# Patient Record
Sex: Female | Born: 1950 | Smoking: Former smoker
Health system: Southern US, Community
[De-identification: ages and names within clinical notes are randomized; demographics above are authoritative.]

## PROBLEM LIST (undated history)

## (undated) DIAGNOSIS — H409 Unspecified glaucoma: Secondary | ICD-10-CM

## (undated) DIAGNOSIS — I10 Essential (primary) hypertension: Secondary | ICD-10-CM

## (undated) DIAGNOSIS — E785 Hyperlipidemia, unspecified: Secondary | ICD-10-CM

---

## 2011-12-26 ENCOUNTER — Other Ambulatory Visit: Payer: Self-pay | Admitting: *Deleted

## 2016-09-25 HISTORY — PX: CATARACT EXTRACTION: SUR2

## 2020-01-28 ENCOUNTER — Other Ambulatory Visit: Payer: Self-pay | Admitting: Nurse Practitioner

## 2020-01-28 DIAGNOSIS — N6320 Unspecified lump in the left breast, unspecified quadrant: Secondary | ICD-10-CM

## 2020-02-12 ENCOUNTER — Other Ambulatory Visit: Payer: Self-pay

## 2020-02-12 ENCOUNTER — Ambulatory Visit
Admission: RE | Admit: 2020-02-12 | Discharge: 2020-02-12 | Disposition: A | Discharge status: Home / Self Care | Payer: Medicare Other | Source: Ambulatory Visit | Attending: Nurse Practitioner | Admitting: Nurse Practitioner

## 2020-02-12 DIAGNOSIS — N6320 Unspecified lump in the left breast, unspecified quadrant: Secondary | ICD-10-CM

## 2020-02-26 ENCOUNTER — Ambulatory Visit: Payer: Self-pay | Admitting: General Surgery

## 2020-02-26 DIAGNOSIS — N6022 Fibroadenosis of left breast: Secondary | ICD-10-CM

## 2020-03-10 ENCOUNTER — Other Ambulatory Visit: Payer: Self-pay | Admitting: General Surgery

## 2020-03-10 DIAGNOSIS — N6022 Fibroadenosis of left breast: Secondary | ICD-10-CM

## 2020-04-07 ENCOUNTER — Encounter (HOSPITAL_BASED_OUTPATIENT_CLINIC_OR_DEPARTMENT_OTHER): Payer: Self-pay | Admitting: General Surgery

## 2020-04-07 ENCOUNTER — Other Ambulatory Visit: Payer: Self-pay

## 2020-04-10 ENCOUNTER — Inpatient Hospital Stay (HOSPITAL_COMMUNITY): Admission: RE | Admit: 2020-04-10 | Payer: 59 | Source: Ambulatory Visit

## 2020-04-12 ENCOUNTER — Other Ambulatory Visit (HOSPITAL_COMMUNITY)
Admission: RE | Admit: 2020-04-12 | Discharge: 2020-04-12 | Disposition: A | Discharge status: Home / Self Care | Payer: 59 | Source: Ambulatory Visit | Attending: General Surgery | Admitting: General Surgery

## 2020-04-12 ENCOUNTER — Encounter (HOSPITAL_BASED_OUTPATIENT_CLINIC_OR_DEPARTMENT_OTHER)
Admission: RE | Admit: 2020-04-12 | Discharge: 2020-04-12 | Disposition: A | Discharge status: Home / Self Care | Payer: 59 | Source: Ambulatory Visit | Attending: General Surgery | Admitting: General Surgery

## 2020-04-12 DIAGNOSIS — Z01810 Encounter for preprocedural cardiovascular examination: Secondary | ICD-10-CM | POA: Diagnosis present

## 2020-04-12 DIAGNOSIS — Z20822 Contact with and (suspected) exposure to covid-19: Secondary | ICD-10-CM | POA: Diagnosis not present

## 2020-04-12 LAB — SARS CORONAVIRUS 2 (TAT 6-24 HRS): SARS Coronavirus 2: NEGATIVE

## 2020-04-12 NOTE — Progress Notes (Signed)

## 2020-04-13 ENCOUNTER — Other Ambulatory Visit: Payer: Self-pay

## 2020-04-13 ENCOUNTER — Ambulatory Visit
Admission: RE | Admit: 2020-04-13 | Discharge: 2020-04-13 | Disposition: A | Discharge status: Home / Self Care | Payer: Medicare Other | Source: Ambulatory Visit | Attending: General Surgery | Admitting: General Surgery

## 2020-04-13 DIAGNOSIS — N6022 Fibroadenosis of left breast: Secondary | ICD-10-CM

## 2020-04-14 ENCOUNTER — Encounter (HOSPITAL_BASED_OUTPATIENT_CLINIC_OR_DEPARTMENT_OTHER): Payer: Self-pay | Admitting: General Surgery

## 2020-04-14 ENCOUNTER — Ambulatory Visit
Admission: RE | Admit: 2020-04-14 | Discharge: 2020-04-14 | Disposition: A | Discharge status: Home / Self Care | Payer: Medicare Other | Source: Ambulatory Visit | Attending: General Surgery | Admitting: General Surgery

## 2020-04-14 ENCOUNTER — Encounter (HOSPITAL_BASED_OUTPATIENT_CLINIC_OR_DEPARTMENT_OTHER)
Admission: RE | Disposition: A | Discharge status: Home / Self Care | Payer: Self-pay | Source: Home / Self Care | Attending: General Surgery

## 2020-04-14 ENCOUNTER — Other Ambulatory Visit: Payer: Self-pay

## 2020-04-14 ENCOUNTER — Ambulatory Visit (HOSPITAL_BASED_OUTPATIENT_CLINIC_OR_DEPARTMENT_OTHER)
Admission: RE | Admit: 2020-04-14 | Discharge: 2020-04-14 | Disposition: A | Discharge status: Home / Self Care | Payer: 59 | Attending: General Surgery | Admitting: General Surgery

## 2020-04-14 ENCOUNTER — Ambulatory Visit (HOSPITAL_BASED_OUTPATIENT_CLINIC_OR_DEPARTMENT_OTHER): Payer: 59 | Admitting: Anesthesiology

## 2020-04-14 DIAGNOSIS — Z79899 Other long term (current) drug therapy: Secondary | ICD-10-CM | POA: Insufficient documentation

## 2020-04-14 DIAGNOSIS — N6489 Other specified disorders of breast: Secondary | ICD-10-CM | POA: Insufficient documentation

## 2020-04-14 DIAGNOSIS — N6022 Fibroadenosis of left breast: Secondary | ICD-10-CM

## 2020-04-14 DIAGNOSIS — Z87891 Personal history of nicotine dependence: Secondary | ICD-10-CM | POA: Insufficient documentation

## 2020-04-14 DIAGNOSIS — Z8249 Family history of ischemic heart disease and other diseases of the circulatory system: Secondary | ICD-10-CM | POA: Insufficient documentation

## 2020-04-14 DIAGNOSIS — I10 Essential (primary) hypertension: Secondary | ICD-10-CM | POA: Insufficient documentation

## 2020-04-14 HISTORY — PX: BREAST LUMPECTOMY WITH RADIOACTIVE SEED LOCALIZATION: SHX6424

## 2020-04-14 HISTORY — DX: Essential (primary) hypertension: I10

## 2020-04-14 HISTORY — DX: Unspecified glaucoma: H40.9

## 2020-04-14 HISTORY — DX: Hyperlipidemia, unspecified: E78.5

## 2020-04-14 SURGERY — BREAST LUMPECTOMY WITH RADIOACTIVE SEED LOCALIZATION
Anesthesia: General | Site: Breast | Laterality: Left

## 2020-04-14 MED ORDER — EPHEDRINE SULFATE 50 MG/ML IJ SOLN
INTRAMUSCULAR | Status: DC | PRN
  Administered 2020-04-14: 15 mg via INTRAVENOUS

## 2020-04-14 MED ORDER — CEFAZOLIN SODIUM-DEXTROSE 2-4 GM/100ML-% IV SOLN
2.0000 g | INTRAVENOUS | Status: AC
  Administered 2020-04-14: 2 g via INTRAVENOUS

## 2020-04-14 MED ORDER — ONDANSETRON HCL 4 MG/2ML IJ SOLN: 4.0000 mg | Freq: Once | INTRAMUSCULAR | Status: DC | PRN

## 2020-04-14 MED ORDER — PROPOFOL 10 MG/ML IV BOLUS
INTRAVENOUS | Status: DC | PRN
  Administered 2020-04-14: 120 mg via INTRAVENOUS

## 2020-04-14 MED ORDER — CELECOXIB 200 MG PO CAPS
ORAL_CAPSULE | ORAL | Status: AC
  Filled 2020-04-14: qty 1

## 2020-04-14 MED ORDER — ONDANSETRON HCL 4 MG/2ML IJ SOLN
INTRAMUSCULAR | Status: AC
  Filled 2020-04-14: qty 2

## 2020-04-14 MED ORDER — LIDOCAINE HCL (CARDIAC) PF 100 MG/5ML IV SOSY
PREFILLED_SYRINGE | INTRAVENOUS | Status: DC | PRN
  Administered 2020-04-14: 100 mg via INTRAVENOUS

## 2020-04-14 MED ORDER — LIDOCAINE 2% (20 MG/ML) 5 ML SYRINGE
INTRAMUSCULAR | Status: AC
  Filled 2020-04-14: qty 5

## 2020-04-14 MED ORDER — OXYCODONE HCL 5 MG/5ML PO SOLN: 5.0000 mg | Freq: Once | ORAL | Status: DC | PRN

## 2020-04-14 MED ORDER — CHLORHEXIDINE GLUCONATE CLOTH 2 % EX PADS: 6.0000 | MEDICATED_PAD | Freq: Once | CUTANEOUS | Status: DC

## 2020-04-14 MED ORDER — EPHEDRINE 5 MG/ML INJ
INTRAVENOUS | Status: AC
  Filled 2020-04-14: qty 10

## 2020-04-14 MED ORDER — ACETAMINOPHEN 500 MG PO TABS
1000.0000 mg | ORAL_TABLET | ORAL | Status: AC
  Administered 2020-04-14: 1000 mg via ORAL

## 2020-04-14 MED ORDER — BUPIVACAINE HCL (PF) 0.25 % IJ SOLN
INTRAMUSCULAR | Status: DC | PRN
  Administered 2020-04-14: 15 mL

## 2020-04-14 MED ORDER — OXYCODONE HCL 5 MG PO TABS: 5.0000 mg | ORAL_TABLET | Freq: Once | ORAL | Status: DC | PRN

## 2020-04-14 MED ORDER — DEXAMETHASONE SODIUM PHOSPHATE 4 MG/ML IJ SOLN
INTRAMUSCULAR | Status: DC | PRN
  Administered 2020-04-14: 4 mg via INTRAVENOUS

## 2020-04-14 MED ORDER — ACETAMINOPHEN 500 MG PO TABS
ORAL_TABLET | ORAL | Status: AC
  Filled 2020-04-14: qty 2

## 2020-04-14 MED ORDER — HYDROMORPHONE HCL 1 MG/ML IJ SOLN: 0.2500 mg | INTRAMUSCULAR | Status: DC | PRN

## 2020-04-14 MED ORDER — FENTANYL CITRATE (PF) 100 MCG/2ML IJ SOLN
INTRAMUSCULAR | Status: DC | PRN
  Administered 2020-04-14: 100 ug via INTRAVENOUS

## 2020-04-14 MED ORDER — DEXAMETHASONE SODIUM PHOSPHATE 10 MG/ML IJ SOLN
INTRAMUSCULAR | Status: AC
  Filled 2020-04-14: qty 1

## 2020-04-14 MED ORDER — FENTANYL CITRATE (PF) 100 MCG/2ML IJ SOLN
INTRAMUSCULAR | Status: AC
  Filled 2020-04-14: qty 2

## 2020-04-14 MED ORDER — GABAPENTIN 300 MG PO CAPS
300.0000 mg | ORAL_CAPSULE | ORAL | Status: AC
  Administered 2020-04-14: 300 mg via ORAL

## 2020-04-14 MED ORDER — LACTATED RINGERS IV SOLN: INTRAVENOUS | Status: DC

## 2020-04-14 MED ORDER — ONDANSETRON HCL 4 MG/2ML IJ SOLN
INTRAMUSCULAR | Status: DC | PRN
  Administered 2020-04-14: 4 mg via INTRAVENOUS

## 2020-04-14 MED ORDER — GABAPENTIN 300 MG PO CAPS
ORAL_CAPSULE | ORAL | Status: AC
  Filled 2020-04-14: qty 1

## 2020-04-14 MED ORDER — CEFAZOLIN SODIUM-DEXTROSE 2-4 GM/100ML-% IV SOLN
INTRAVENOUS | Status: AC
  Filled 2020-04-14: qty 100

## 2020-04-14 MED ORDER — CELECOXIB 200 MG PO CAPS
200.0000 mg | ORAL_CAPSULE | ORAL | Status: AC
  Administered 2020-04-14: 200 mg via ORAL

## 2020-04-14 MED ORDER — HYDROCODONE-ACETAMINOPHEN 5-325 MG PO TABS: 1.0000 | ORAL_TABLET | Freq: Four times a day (QID) | ORAL | 0 refills | Status: AC | PRN

## 2020-04-14 SURGICAL SUPPLY — 43 items
ADH SKN CLS APL DERMABOND .7 (GAUZE/BANDAGES/DRESSINGS) ×1
APL PRP STRL LF DISP 70% ISPRP (MISCELLANEOUS) ×1
APPLIER CLIP 9.375 MED OPEN (MISCELLANEOUS)
APR CLP MED 9.3 20 MLT OPN (MISCELLANEOUS)
BLADE SURG 15 STRL LF DISP TIS (BLADE) ×1 IMPLANT
BLADE SURG 15 STRL SS (BLADE) ×2
CANISTER SUC SOCK COL 7IN (MISCELLANEOUS) ×2 IMPLANT
CANISTER SUCT 1200ML W/VALVE (MISCELLANEOUS) ×2 IMPLANT
CHLORAPREP W/TINT 26 (MISCELLANEOUS) ×2 IMPLANT
CLIP APPLIE 9.375 MED OPEN (MISCELLANEOUS) IMPLANT
COVER BACK TABLE 60X90IN (DRAPES) ×2 IMPLANT
COVER MAYO STAND STRL (DRAPES) ×2 IMPLANT
COVER PROBE W GEL 5X96 (DRAPES) ×2 IMPLANT
COVER WAND RF STERILE (DRAPES) IMPLANT
DECANTER SPIKE VIAL GLASS SM (MISCELLANEOUS) IMPLANT
DERMABOND ADVANCED (GAUZE/BANDAGES/DRESSINGS) ×1
DERMABOND ADVANCED .7 DNX12 (GAUZE/BANDAGES/DRESSINGS) ×1 IMPLANT
DRAPE LAPAROSCOPIC ABDOMINAL (DRAPES) ×2 IMPLANT
DRAPE UTILITY XL STRL (DRAPES) ×2 IMPLANT
ELECT COATED BLADE 2.86 ST (ELECTRODE) ×2 IMPLANT
ELECT REM PT RETURN 9FT ADLT (ELECTROSURGICAL) ×2
ELECTRODE REM PT RTRN 9FT ADLT (ELECTROSURGICAL) ×1 IMPLANT
GLOVE BIO SURGEON STRL SZ7.5 (GLOVE) ×4 IMPLANT
GOWN STRL REUS W/ TWL LRG LVL3 (GOWN DISPOSABLE) ×2 IMPLANT
GOWN STRL REUS W/TWL LRG LVL3 (GOWN DISPOSABLE) ×4
ILLUMINATOR WAVEGUIDE N/F (MISCELLANEOUS) IMPLANT
KIT MARKER MARGIN INK (KITS) ×2 IMPLANT
LIGHT WAVEGUIDE WIDE FLAT (MISCELLANEOUS) IMPLANT
NDL HYPO 25X1 1.5 SAFETY (NEEDLE) IMPLANT
NEEDLE HYPO 25X1 1.5 SAFETY (NEEDLE) IMPLANT
NS IRRIG 1000ML POUR BTL (IV SOLUTION) IMPLANT
PACK BASIN DAY SURGERY FS (CUSTOM PROCEDURE TRAY) ×2 IMPLANT
PENCIL SMOKE EVACUATOR (MISCELLANEOUS) ×2 IMPLANT
SLEEVE SCD COMPRESS KNEE MED (MISCELLANEOUS) ×2 IMPLANT
SPONGE LAP 18X18 RF (DISPOSABLE) ×2 IMPLANT
SUT MON AB 4-0 PC3 18 (SUTURE) ×2 IMPLANT
SUT SILK 2 0 SH (SUTURE) IMPLANT
SUT VICRYL 3-0 CR8 SH (SUTURE) ×2 IMPLANT
SYR CONTROL 10ML LL (SYRINGE) IMPLANT
TOWEL GREEN STERILE FF (TOWEL DISPOSABLE) ×2 IMPLANT
TRAY FAXITRON CT DISP (TRAY / TRAY PROCEDURE) ×2 IMPLANT
TUBE CONNECTING 20X1/4 (TUBING) ×2 IMPLANT
YANKAUER SUCT BULB TIP NO VENT (SUCTIONS) IMPLANT

## 2020-04-14 NOTE — Transfer of Care (Signed)
Immediate Anesthesia Transfer of Care Note  Patient: Valerie Sanchez  Procedure(s) Performed: LEFT BREAST LUMPECTOMY WITH RADIOACTIVE SEED LOCALIZATION (Left Breast)  Patient Location: PACU  Anesthesia Type:General  Level of Consciousness: sedated  Airway & Oxygen Therapy: Patient Spontanous Breathing and Patient connected to face mask oxygen  Post-op Assessment: Report given to RN and Post -op Vital signs reviewed and stable  Post vital signs: Reviewed and stable  Last Vitals:  Vitals Value Taken Time  BP    Temp    Pulse 66 04/14/20 1020  Resp 11 04/14/20 1020  SpO2 98 % 04/14/20 1020  Vitals shown include unvalidated device data.  Last Pain:  Vitals:   04/14/20 0847  TempSrc: Oral  PainSc: 0-No pain      Patients Stated Pain Goal: 1 (04/14/20 0847)  Complications: No complications documented.

## 2020-04-14 NOTE — Anesthesia Preprocedure Evaluation (Signed)
Anesthesia Evaluation  Patient identified by MRN, date of birth, ID band Patient awake    Reviewed: Allergy & Precautions, NPO status , Patient's Chart, lab work & pertinent test results  Airway Mallampati: II  TM Distance: >3 FB Neck ROM: Full    Dental  (+) Edentulous Upper, Edentulous Lower   Pulmonary former smoker,    Pulmonary exam normal breath sounds clear to auscultation       Cardiovascular hypertension, Pt. on medications Normal cardiovascular exam Rhythm:Regular Rate:Normal     Neuro/Psych negative neurological ROS  negative psych ROS   GI/Hepatic negative GI ROS, Neg liver ROS,   Endo/Other  negative endocrine ROS  Renal/GU negative Renal ROS     Musculoskeletal negative musculoskeletal ROS (+)   Abdominal   Peds  Hematology negative hematology ROS (+)   Anesthesia Other Findings   Reproductive/Obstetrics negative OB ROS                            Anesthesia Physical Anesthesia Plan  ASA: II  Anesthesia Plan: General   Post-op Pain Management:    Induction: Intravenous  PONV Risk Score and Plan: 4 or greater and Ondansetron, Dexamethasone, Treatment may vary due to age or medical condition and Midazolam  Airway Management Planned: LMA  Additional Equipment: None  Intra-op Plan:   Post-operative Plan: Extubation in OR  Informed Consent: I have reviewed the patients History and Physical, chart, labs and discussed the procedure including the risks, benefits and alternatives for the proposed anesthesia with the patient or authorized representative who has indicated his/her understanding and acceptance.     Dental advisory given  Plan Discussed with: CRNA  Anesthesia Plan Comments:         Anesthesia Quick Evaluation

## 2020-04-14 NOTE — Anesthesia Procedure Notes (Signed)
Procedure Name: LMA Insertion Performed by: Eldra Word, [place], CRNA Pre-anesthesia Checklist: Patient identified, Emergency Drugs available, Suction available and Patient being monitored Patient Re-evaluated:Patient Re-evaluated prior to induction Oxygen Delivery Method: Circle system utilized Preoxygenation: Pre-oxygenation with 100% oxygen Induction Type: IV induction Ventilation: Mask ventilation without difficulty LMA: LMA inserted LMA Size: 4.0 Number of attempts: 1 Airway Equipment and Method: Bite block Placement Confirmation: positive ETCO2 Tube secured with: Tape Dental Injury: Teeth and Oropharynx as per pre-operative assessment        

## 2020-04-14 NOTE — Discharge Instructions (Signed)
No TYLENOL PRODUCTS UNTIL 2:50 PM  NO IBUPROFEN UNTIL 5:00 PM   Post Anesthesia Home Care Instructions  Activity: Get plenty of rest for the remainder of the day. A responsible individual must stay with you for 24 hours following the procedure.  For the next 24 hours, DO NOT: -Drive a car -Advertising copywriter -Drink alcoholic beverages -Take any medication unless instructed by your physician -Make any legal decisions or sign important papers.  Meals: Start with liquid foods such as gelatin or soup. Progress to regular foods as tolerated. Avoid greasy, spicy, heavy foods. If nausea and/or vomiting occur, drink only clear liquids until the nausea and/or vomiting subsides. Call your physician if vomiting continues.  Special Instructions/Symptoms: Your throat may feel dry or sore from the anesthesia or the breathing tube placed in your throat during surgery. If this causes discomfort, gargle with warm salt water. The discomfort should disappear within 24 hours.  If you had a scopolamine patch placed behind your ear for the management of post- operative nausea and/or vomiting:  1. The medication in the patch is effective for 72 hours, after which it should be removed.  Wrap patch in a tissue and discard in the trash. Wash hands thoroughly with soap and water. 2. You may remove the patch earlier than 72 hours if you experience unpleasant side effects which may include dry mouth, dizziness or visual disturbances. 3. Avoid touching the patch. Wash your hands with soap and water after contact with the patch.

## 2020-04-14 NOTE — Op Note (Signed)
04/14/2020  10:10 AM  PATIENT:  Valerie Sanchez  69 y.o. female  PRE-OPERATIVE DIAGNOSIS:  left breast complex sclerosing lesion  POST-OPERATIVE DIAGNOSIS:  left breast complex sclerosing lesion  PROCEDURE:  Procedure(s): LEFT BREAST LUMPECTOMY WITH RADIOACTIVE SEED LOCALIZATION (Left)  SURGEON:  Surgeon(s) and Role:    * Griselda Miner, MD - Primary  PHYSICIAN ASSISTANT:   ASSISTANTS: none   ANESTHESIA:   local and general  EBL:  10 mL   BLOOD ADMINISTERED:none  DRAINS: none   LOCAL MEDICATIONS USED:  MARCAINE     SPECIMEN:  Source of Specimen:  left breast tissue  DISPOSITION OF SPECIMEN:  PATHOLOGY  COUNTS:  YES  TOURNIQUET:  * No tourniquets in log *  DICTATION: .Dragon Dictation   After informed consent was obtained the patient was brought to the operating room and placed in the supine position on the operating table.  After adequate induction of general anesthesia the patient's left breast was prepped with ChloraPrep, allowed to dry, and draped in usual sterile manner.  An appropriate timeout was performed.  Previously an I-125 seed was placed in the lateral aspect of the left breast to mark an area of complex sclerosing lesion.  The neoprobe was set to I-125 in the area of radioactivity was readily identified.  The area around this was infiltrated with quarter percent Marcaine.  A curvilinear incision was made vertically on the outer aspect of the left breast with a 15 blade knife.  The incision was carried through the skin and subcutaneous tissue sharply with the electrocautery.  Dissection was then carried towards the radioactive seed under the direction of the neoprobe.  Once I more closely approached the radioactive seed I then removed a circular portion of breast tissue sharply with the electrocautery around the radioactive seed while checking the area of radioactivity frequently.  Once the specimen was removed it was oriented with the appropriate paint colors.   A specimen radiograph was obtained that showed the clip and seed to be near the center of the specimen.  The specimen was then sent to pathology for further evaluation.  Hemostasis was achieved using the Bovie electrocautery.  The wound was irrigated with saline and infiltrated with more quarter percent Marcaine.  The deep layer of the wound was then closed with layers of interrupted 3-0 Vicryl stitches.  The skin was closed with a running 4-0 Monocryl subcuticular stitch.  Dermabond dressings were applied.  The patient tolerated the procedure well.  At the end of the case all needle sponge and instrument counts were correct.  The patient was then awakened and taken to recovery in stable condition.  PLAN OF CARE: Discharge to home after PACU  PATIENT DISPOSITION:  PACU - hemodynamically stable.   Delay start of Pharmacological VTE agent (>24hrs) due to surgical blood loss or risk of bleeding: not applicable

## 2020-04-14 NOTE — Anesthesia Postprocedure Evaluation (Signed)
Anesthesia Post Note  Patient: Valerie Sanchez  Procedure(s) Performed: LEFT BREAST LUMPECTOMY WITH RADIOACTIVE SEED LOCALIZATION (Left Breast)     Patient location during evaluation: PACU Anesthesia Type: General Level of consciousness: awake and alert and oriented Pain management: pain level controlled Vital Signs Assessment: post-procedure vital signs reviewed and stable Respiratory status: spontaneous breathing, nonlabored ventilation and respiratory function stable Cardiovascular status: blood pressure returned to baseline Postop Assessment: no apparent nausea or vomiting Anesthetic complications: no   No complications documented.                Kaylyn Layer

## 2020-04-14 NOTE — H&P (Signed)
Valerie Sanchez  Location: [facility] Patient #: [id number] DOB: 12-Nov-1950 Married / Language: English / Race: Refused to Report/Unreported Female   History of Present Illness The patient is a 69 year old female who presents with a breast mass. We are asked to see the patient in consultation by Dr. Frederico Hamman to evaluate her a complex sclerosing lesion of the left breast. The patient is a 69 year old white female who recently went for a routine screening mammogram at [facility]. At that time she was found to have a distortion in the upper outer quadrant of the left breast. This was biopsied and came back as a complex sclerosing lesion. She denies any breast pain or discharge from the nipple. She has no family history of breast cancer. She quit smoking in 1996.   Past Surgical History  Breast Biopsy  Left. multiple Cataract Surgery  Right. Cesarean Section - 1   Diagnostic Studies History  Colonoscopy  5-10 years ago Mammogram  within last year Pap Smear  1-5 years ago  Allergies  No Known Drug Allergies   Medication History  amLODIPine Besy-Benazepril HCl (10-20MG  Capsule, Oral) Active. Medications Reconciled  Social History  Caffeine use  Carbonated beverages, Coffee. No alcohol use  No drug use  Tobacco use  Former smoker.  Family History  Cerebrovascular Accident  Mother. Family history unknown  First Degree Relatives  Hypertension  Mother. Seizure disorder  Father.  Pregnancy / Birth History  Age at menarche  12 years. Contraceptive History  Oral contraceptives. Gravida  2 Maternal age  3-30 Para  2  Other Problems  High blood pressure  Hypercholesterolemia     Review of Systems  General Not Present- Appetite Loss, Chills, Fatigue, Fever, Night Sweats, Weight Gain and Weight Loss. Skin Not Present- Change in Wart/Mole, Dryness, Hives, Jaundice, New Lesions, Non-Healing Wounds, Rash and  Ulcer. HEENT Present- Wears glasses/contact lenses. Not Present- Earache, Hearing Loss, Hoarseness, Nose Bleed, Oral Ulcers, Ringing in the Ears, Seasonal Allergies, Sinus Pain, Sore Throat, Visual Disturbances and Yellow Eyes. Respiratory Not Present- Bloody sputum, Chronic Cough, Difficulty Breathing, Snoring and Wheezing. Breast Not Present- Breast Mass, Breast Pain, Nipple Discharge and Skin Changes. Cardiovascular Not Present- Chest Pain, Difficulty Breathing Lying Down, Leg Cramps, Palpitations, Rapid Heart Rate, Shortness of Breath and Swelling of Extremities. Gastrointestinal Not Present- Abdominal Pain, Bloating, Bloody Stool, Change in Bowel Habits, Chronic diarrhea, Constipation, Difficulty Swallowing, Excessive gas, Gets full quickly at meals, Hemorrhoids, Indigestion, Nausea, Rectal Pain and Vomiting. Female Genitourinary Not Present- Frequency, Nocturia, Painful Urination, Pelvic Pain and Urgency. Musculoskeletal Not Present- Back Pain, Joint Pain, Joint Stiffness, Muscle Pain, Muscle Weakness and Swelling of Extremities. Neurological Not Present- Decreased Memory, Fainting, Headaches, Numbness, Seizures, Tingling, Tremor, Trouble walking and Weakness. Psychiatric Not Present- Anxiety, Bipolar, Change in Sleep Pattern, Depression, Fearful and Frequent crying. Endocrine Not Present- Cold Intolerance, Excessive Hunger, Hair Changes, Heat Intolerance, Hot flashes and New Diabetes. Hematology Present- Easy Bruising. Not Present- Blood Thinners, Excessive bleeding, Gland problems, HIV and Persistent Infections.  Vitals  Weight: 182.13 lb Height: 65in Body Surface Area: 1.9 m Body Mass Index: 30.31 kg/m  Temp.: 57F  Pulse: 76 (Regular)        Physical Exam  General Mental Status-Alert. General Appearance-Consistent with stated age. Hydration-Well hydrated. Voice-Normal.  Head and Neck Head-normocephalic, atraumatic with no lesions or palpable  masses. Trachea-midline. Thyroid Gland Characteristics - normal size and consistency.  Eye Eyeball - Bilateral-Extraocular movements intact. Sclera/Conjunctiva - Bilateral-No scleral icterus.  Chest and Lung Exam Chest and lung exam reveals -quiet, even and easy respiratory effort with no use of accessory muscles and on auscultation, normal breath sounds, no adventitious sounds and normal vocal resonance. Inspection Chest Wall - Normal. Back - normal.  Breast Note: There is no palpable mass in either breast. There is no palpable axillary, supraclavicular, or cervical lymphadenopathy.   Cardiovascular Cardiovascular examination reveals -normal heart sounds, regular rate and rhythm with no murmurs and normal pedal pulses bilaterally.  Abdomen Inspection Inspection of the abdomen reveals - No Hernias. Skin - Scar - no surgical scars. Palpation/Percussion Palpation and Percussion of the abdomen reveal - Soft, Non Tender, No Rebound tenderness, No Rigidity (guarding) and No hepatosplenomegaly. Auscultation Auscultation of the abdomen reveals - Bowel sounds normal.  Neurologic Neurologic evaluation reveals -alert and oriented x 3 with no impairment of recent or remote memory. Mental Status-Normal.  Musculoskeletal Normal Exam - Left-Upper Extremity Strength Normal and Lower Extremity Strength Normal. Normal Exam - Right-Upper Extremity Strength Normal and Lower Extremity Strength Normal.  Lymphatic Head & Neck  General Head & Neck Lymphatics: Bilateral - Description - Normal. Axillary  General Axillary Region: Bilateral - Description - Normal. Tenderness - Non Tender. Femoral & Inguinal  Generalized Femoral & Inguinal Lymphatics: Bilateral - Description - Normal. Tenderness - Non Tender.    Assessment & Plan SCLEROSING ADENOSIS OF BREAST, LEFT (N60.22) Impression: The patient appears to have a small area of complex sclerosing lesion in the upper outer  quadrant of the left breast. Because of its abnormal appearance and because there is a 5-10% chance of missing something more significant the recommendation is to have this area removed. I have discussed with her in detail the risks and benefits of the operation to do this as well as some of the technical aspects including the use of a radioactive seed for localization and she understands and wishes to proceed. This patient encounter took 45 minutes today to perform the following: take history, perform exam, review outside records, interpret imaging, counsel the patient on their diagnosis and document encounter, findings & plan in the EHR Current Plans Pt Education - Breast Diseases: discussed with patient and provided information.

## 2020-04-14 NOTE — Interval H&P Note (Signed)
History and Physical Interval Note:  04/14/2020 9:15 AM  Valerie Sanchez  has presented today for surgery, with the diagnosis of left breast complex sclerosing lesion.  The various methods of treatment have been discussed with the patient and family. After consideration of risks, benefits and other options for treatment, the patient has consented to  Procedure(s): LEFT BREAST LUMPECTOMY WITH RADIOACTIVE SEED LOCALIZATION (Left) as a surgical intervention.  The patient's history has been reviewed, patient examined, no change in status, stable for surgery.  I have reviewed the patient's chart and labs.  Questions were answered to the patient's satisfaction.     [name]

## 2020-04-15 ENCOUNTER — Encounter (HOSPITAL_BASED_OUTPATIENT_CLINIC_OR_DEPARTMENT_OTHER): Payer: Self-pay | Admitting: General Surgery

## 2020-04-15 LAB — SURGICAL PATHOLOGY

## 2021-01-07 IMAGING — MG MM PLC BREAST LOC DEV 1ST LESION INC MAMMO GUIDE*L*
8 of 9 series · 8 of 9 positions shown · non-contrast
Comparison: Previous exam(s).

CLINICAL DATA: 68-year-old female for radioactive seed localization
of LEFT breast complex sclerosing lesion prior to excision.

EXAM:
MAMMOGRAPHIC GUIDED RADIOACTIVE SEED LOCALIZATION OF THE LEFT BREAST

[L CC (1 of 5)]
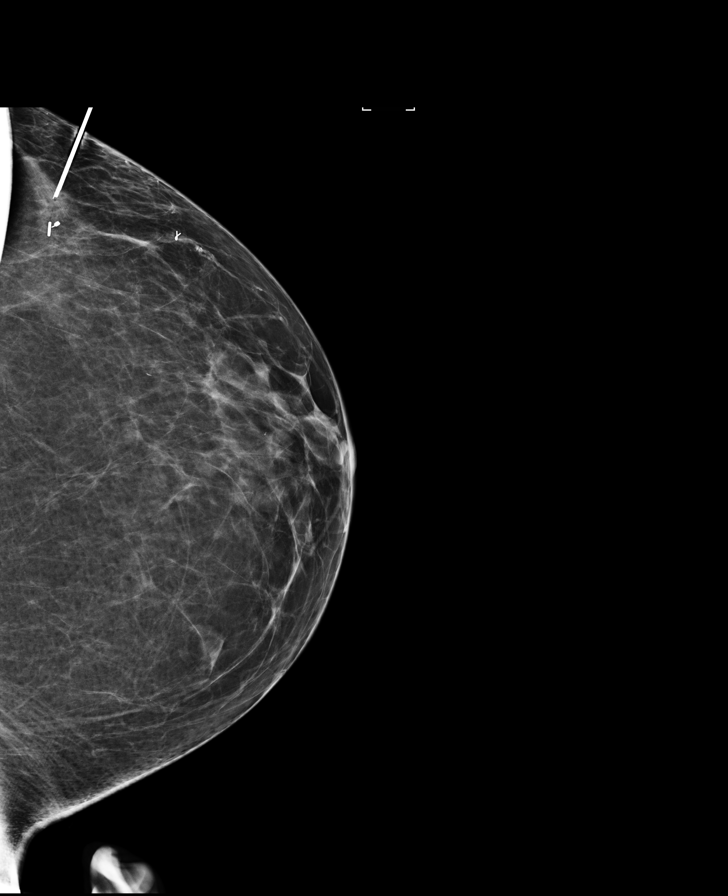

[L CC (2 of 5)]
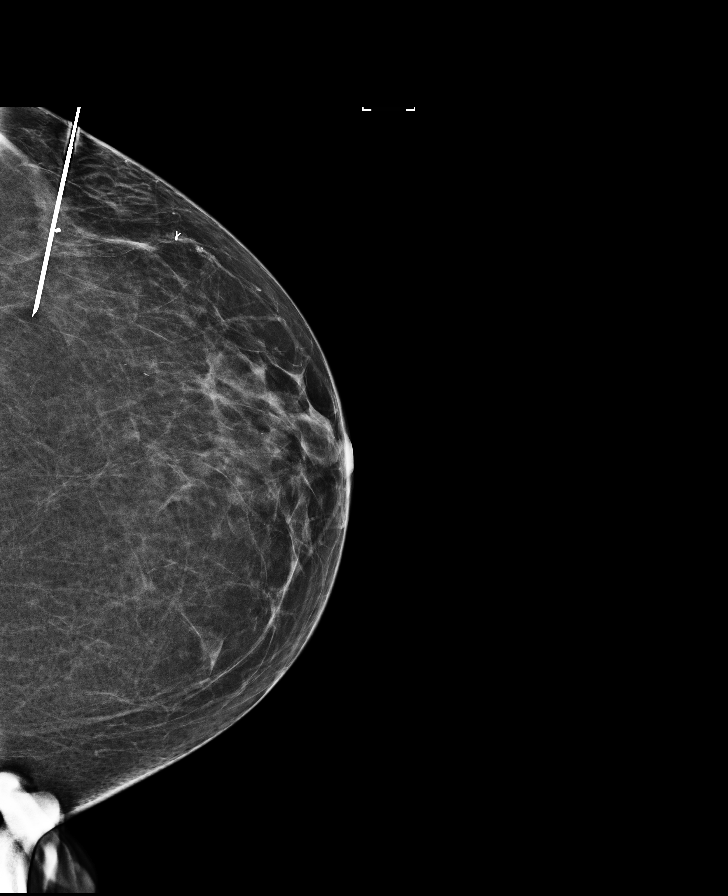

[L LM (1 of 3)]
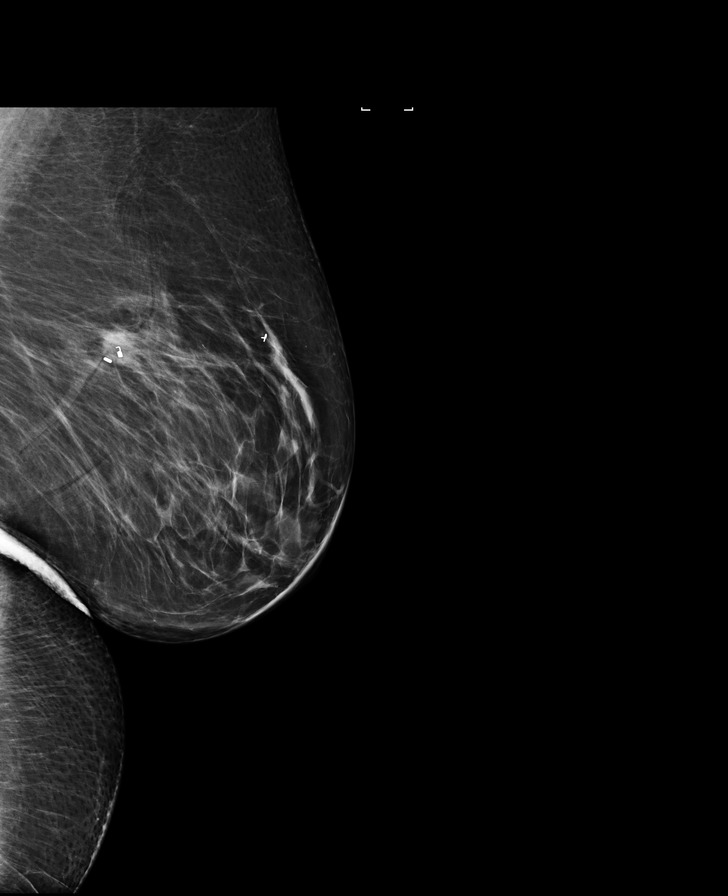

[L CC (3 of 5)]
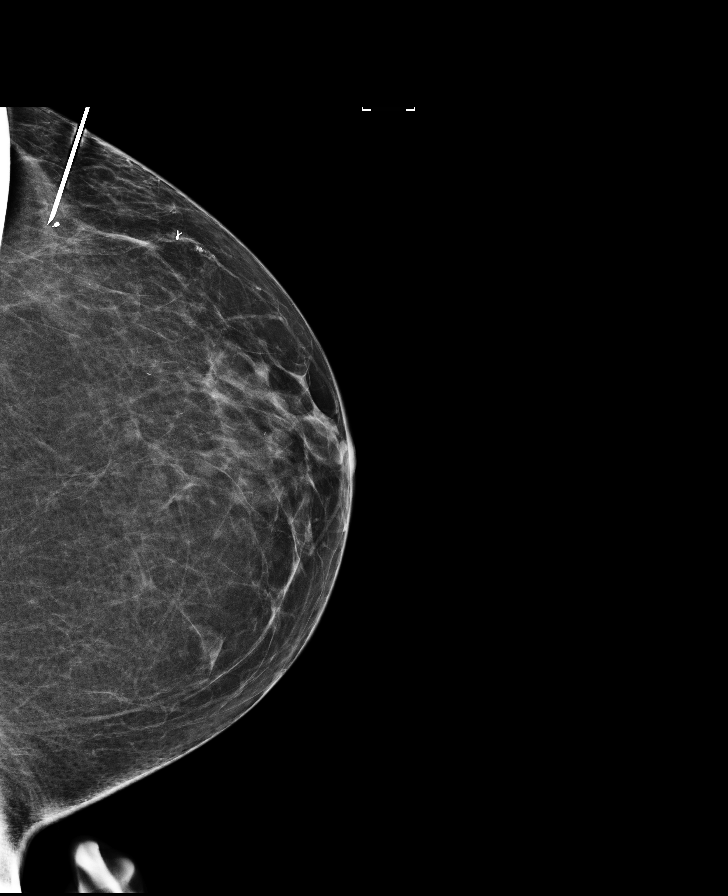

[L LM (2 of 3)]
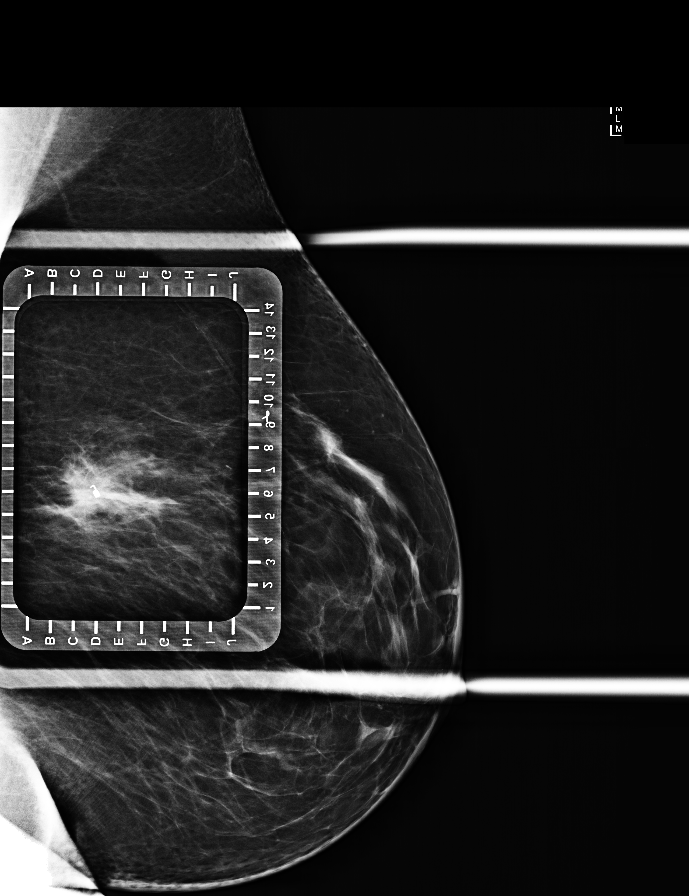

[L CC (4 of 5)]
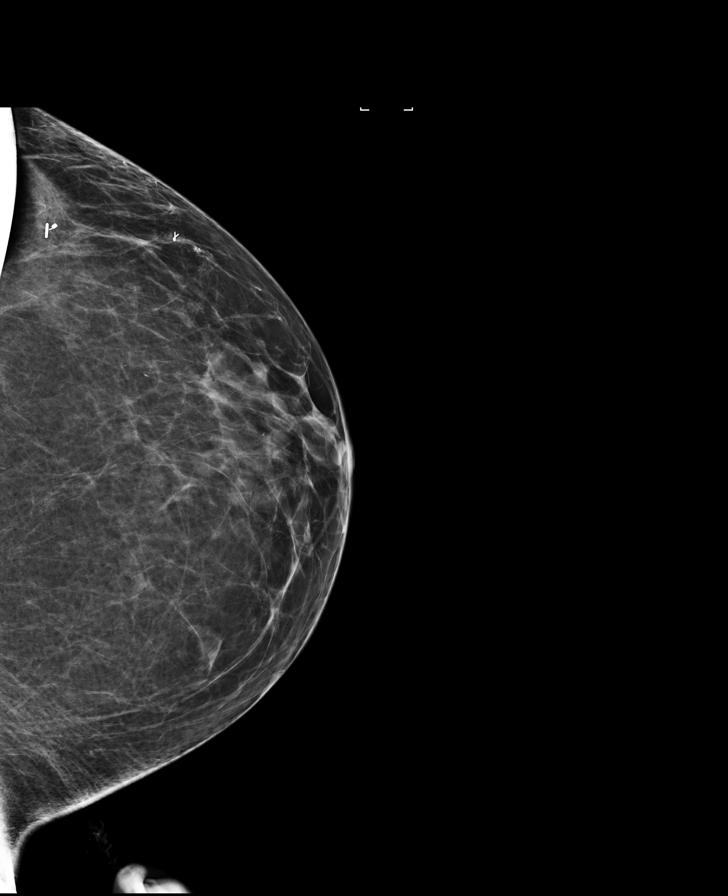

[L LM (3 of 3)]
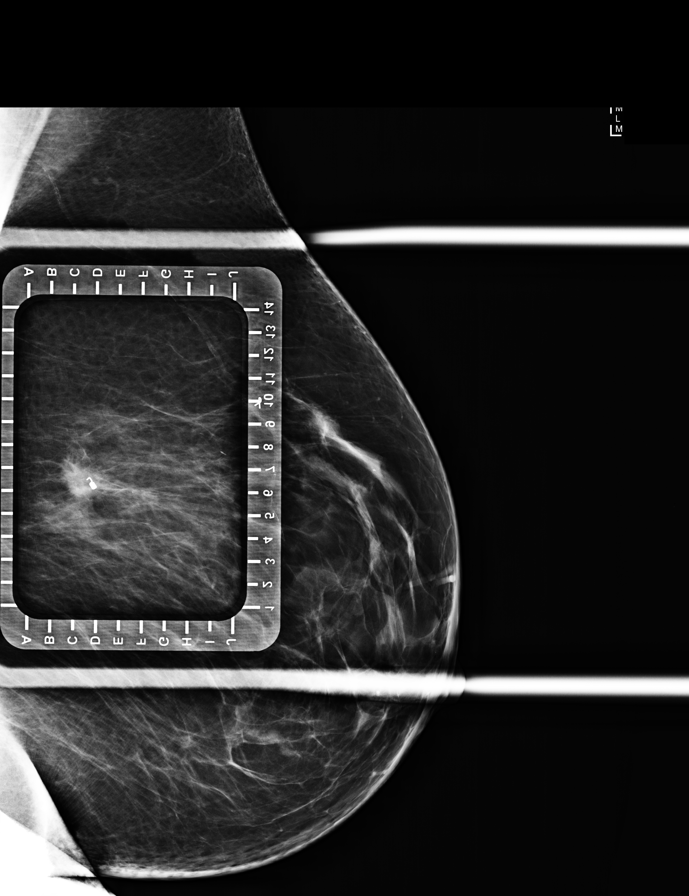

[L CC (5 of 5)]
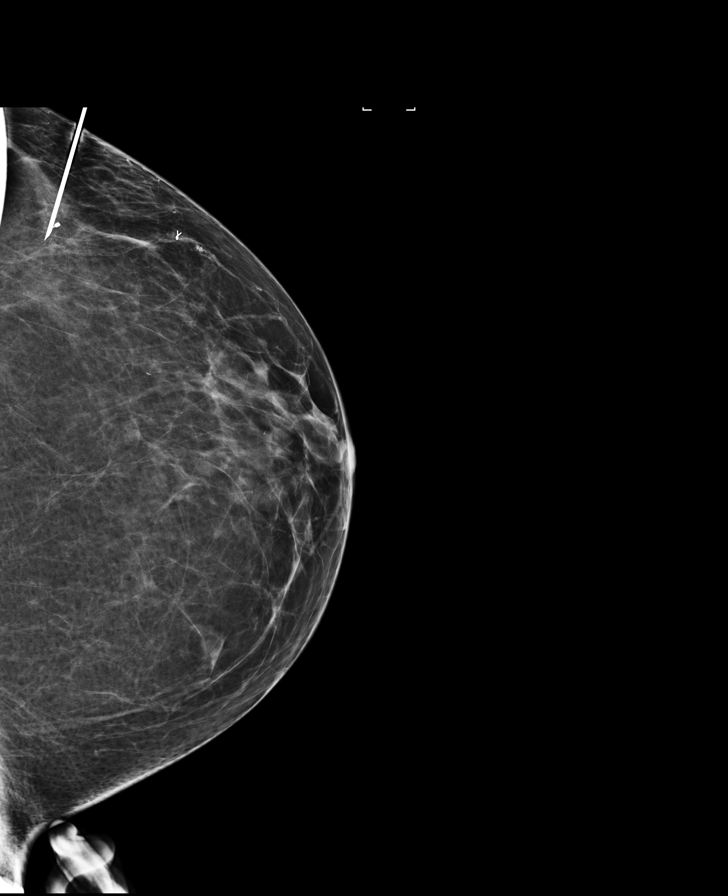

[8 of 9 positions shown; findings below may reference images not displayed]

FINDINGS: Patient presents for radioactive seed localization prior to LEFT
breast excision. I met with the patient and we discussed the
procedure of seed localization including benefits and alternatives.
We discussed the high likelihood of a successful procedure. We
discussed the risks of the procedure including infection, bleeding,
tissue injury and further surgery. We discussed the low dose of
radioactivity involved in the procedure. Informed, written consent
was given.

The usual time-out protocol was performed immediately prior to the
procedure.

Using mammographic guidance, sterile technique, 1% lidocaine and an
[id number] radioactive seed, the COIL clip was localized using a LATERAL
approach. The follow-up mammogram images confirm the seed in the
expected location and were marked for Dr. Papon.

Follow-up survey of the patient confirms presence of the radioactive
seed.

Order number of [id number] seed:  [id number].

Total activity:  0.246 millicuries.  Reference Date: 03/19/2020

The patient tolerated the procedure well and was released from the
[REDACTED]. She was given instructions regarding seed removal.
IMPRESSION: Radioactive seed localization LEFT breast. No apparent
complications.

## 2021-04-13 ENCOUNTER — Encounter (HOSPITAL_COMMUNITY): Payer: Self-pay

## 2023-02-01 DIAGNOSIS — Z20822 Contact with and (suspected) exposure to covid-19: Secondary | ICD-10-CM | POA: Diagnosis not present

## 2023-02-01 DIAGNOSIS — Z1152 Encounter for screening for COVID-19: Secondary | ICD-10-CM | POA: Diagnosis not present

## 2023-03-27 DIAGNOSIS — W1839XA Other fall on same level, initial encounter: Secondary | ICD-10-CM | POA: Diagnosis not present

## 2023-03-27 DIAGNOSIS — M79601 Pain in right arm: Secondary | ICD-10-CM | POA: Diagnosis not present

## 2023-03-27 DIAGNOSIS — S51811A Laceration without foreign body of right forearm, initial encounter: Secondary | ICD-10-CM | POA: Diagnosis not present

## 2023-04-06 DIAGNOSIS — Z4802 Encounter for removal of sutures: Secondary | ICD-10-CM | POA: Diagnosis not present

## 2023-06-08 DIAGNOSIS — M8588 Other specified disorders of bone density and structure, other site: Secondary | ICD-10-CM | POA: Diagnosis not present

## 2023-10-29 DIAGNOSIS — Z043 Encounter for examination and observation following other accident: Secondary | ICD-10-CM | POA: Diagnosis not present

## 2023-10-29 DIAGNOSIS — S0990XA Unspecified injury of head, initial encounter: Secondary | ICD-10-CM | POA: Diagnosis not present

## 2023-10-29 DIAGNOSIS — W19XXXA Unspecified fall, initial encounter: Secondary | ICD-10-CM | POA: Diagnosis not present

## 2023-10-29 DIAGNOSIS — R262 Difficulty in walking, not elsewhere classified: Secondary | ICD-10-CM | POA: Diagnosis not present

## 2023-10-30 DIAGNOSIS — H2512 Age-related nuclear cataract, left eye: Secondary | ICD-10-CM | POA: Diagnosis not present

## 2023-10-30 DIAGNOSIS — Z01818 Encounter for other preprocedural examination: Secondary | ICD-10-CM | POA: Diagnosis not present

## 2023-11-20 DIAGNOSIS — H259 Unspecified age-related cataract: Secondary | ICD-10-CM | POA: Diagnosis not present

## 2023-11-20 DIAGNOSIS — E119 Type 2 diabetes mellitus without complications: Secondary | ICD-10-CM | POA: Diagnosis not present

## 2023-11-20 DIAGNOSIS — H53001 Unspecified amblyopia, right eye: Secondary | ICD-10-CM | POA: Diagnosis not present

## 2023-11-20 DIAGNOSIS — H35373 Puckering of macula, bilateral: Secondary | ICD-10-CM | POA: Diagnosis not present

## 2023-11-20 DIAGNOSIS — H2512 Age-related nuclear cataract, left eye: Secondary | ICD-10-CM | POA: Diagnosis not present

## 2023-12-05 ENCOUNTER — Other Ambulatory Visit: Payer: Self-pay | Admitting: Nephrology

## 2023-12-05 DIAGNOSIS — N1832 Chronic kidney disease, stage 3b: Secondary | ICD-10-CM | POA: Diagnosis not present

## 2023-12-05 DIAGNOSIS — I129 Hypertensive chronic kidney disease with stage 1 through stage 4 chronic kidney disease, or unspecified chronic kidney disease: Secondary | ICD-10-CM | POA: Diagnosis not present

## 2023-12-05 DIAGNOSIS — D631 Anemia in chronic kidney disease: Secondary | ICD-10-CM | POA: Diagnosis not present

## 2023-12-05 DIAGNOSIS — E559 Vitamin D deficiency, unspecified: Secondary | ICD-10-CM | POA: Diagnosis not present

## 2023-12-13 ENCOUNTER — Encounter: Payer: Self-pay | Admitting: Nephrology

## 2023-12-14 ENCOUNTER — Ambulatory Visit
Admission: RE | Admit: 2023-12-14 | Discharge: 2023-12-14 | Disposition: A | Discharge status: Home / Self Care | Source: Ambulatory Visit | Attending: Nephrology | Admitting: Nephrology

## 2023-12-14 DIAGNOSIS — N189 Chronic kidney disease, unspecified: Secondary | ICD-10-CM | POA: Diagnosis not present

## 2023-12-14 DIAGNOSIS — N1832 Chronic kidney disease, stage 3b: Secondary | ICD-10-CM

## 2024-02-28 DIAGNOSIS — E559 Vitamin D deficiency, unspecified: Secondary | ICD-10-CM | POA: Diagnosis not present

## 2024-02-28 DIAGNOSIS — N1832 Chronic kidney disease, stage 3b: Secondary | ICD-10-CM | POA: Diagnosis not present

## 2024-02-28 DIAGNOSIS — I129 Hypertensive chronic kidney disease with stage 1 through stage 4 chronic kidney disease, or unspecified chronic kidney disease: Secondary | ICD-10-CM | POA: Diagnosis not present

## 2024-02-28 DIAGNOSIS — D631 Anemia in chronic kidney disease: Secondary | ICD-10-CM | POA: Diagnosis not present

## 2024-07-02 DIAGNOSIS — E559 Vitamin D deficiency, unspecified: Secondary | ICD-10-CM | POA: Diagnosis not present

## 2024-07-02 DIAGNOSIS — I129 Hypertensive chronic kidney disease with stage 1 through stage 4 chronic kidney disease, or unspecified chronic kidney disease: Secondary | ICD-10-CM | POA: Diagnosis not present

## 2024-07-02 DIAGNOSIS — N182 Chronic kidney disease, stage 2 (mild): Secondary | ICD-10-CM | POA: Diagnosis not present

## 2024-07-02 DIAGNOSIS — D631 Anemia in chronic kidney disease: Secondary | ICD-10-CM | POA: Diagnosis not present

## 2024-08-12 NOTE — Progress Notes (Signed)
 Valerie Sanchez                                          MRN: [id number]   08/12/2024   The [facility] Quality Team Specialist reviewed this patient medical record for the purposes of chart review for care gap closure. The following were reviewed: chart review for care gap closure-breast cancer screening.    [facility] Quality Team
